# Patient Record
Sex: Male | Born: 1995 | Race: White | Hispanic: No | Marital: Single | Smoking: Former smoker
Health system: Southern US, Community
[De-identification: ages and names within clinical notes are randomized; demographics above are authoritative.]

## PROBLEM LIST (undated history)

## (undated) DIAGNOSIS — F909 Attention-deficit hyperactivity disorder, unspecified type: Secondary | ICD-10-CM

## (undated) HISTORY — DX: Attention-deficit hyperactivity disorder, unspecified type: F90.9

---
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
## 2013-03-25 ENCOUNTER — Ambulatory Visit (INDEPENDENT_AMBULATORY_CARE_PROVIDER_SITE_OTHER): Payer: BC Managed Care – PPO | Admitting: Emergency Medicine

## 2013-03-25 VITALS — BP 128/76 | HR 77 | Temp 98.2°F | Resp 16 | Ht 67.5 in | Wt 176.4 lb

## 2013-03-25 DIAGNOSIS — F329 Major depressive disorder, single episode, unspecified: Secondary | ICD-10-CM

## 2013-03-25 MED ORDER — PAROXETINE HCL 20 MG PO TABS: 20.0000 mg | ORAL_TABLET | Freq: Every day | ORAL | Status: DC

## 2013-03-25 NOTE — Progress Notes (Signed)
[facility] [address], [place] [address] [phone]  Date:  03/25/2013   Name:  Jeffrey Chen   DOB:  [birth year]   MRN:  [id number]  PCP:  No primary provider on file.    Chief Complaint: Depression   History of Present Illness:  Jeffrey Chen is a 17 y.o. very pleasant male patient who presents with the following:  Sent in by his dermatologist for evaluation of depression.  Says that he has always been depressed and is now worse. No suicidal ideation or thoughts of harm to others.  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  Poor appetite and sleeping ok.  Has experienced a 20 pound weight loss.  Not sure whether his cystic acne is contributing to his depression.  Never under treatment for depression previously but took xanax for anxiety.  No improvement with over the counter medications or other home remedies. Denies other complaint or health concern today.   There are no active problems to display for this patient.   Past Medical History  Diagnosis Date  . Depression     History reviewed. No pertinent past surgical history.  History  Substance Use Topics  . Smoking status: Former Smoker -- 0.66 packs/day    Types: Cigarettes    Quit date: 05/25/2011  . Smokeless tobacco: Current User    Types: Chew  . Alcohol Use: Yes    History reviewed. No pertinent family history.  No Known Allergies  Medication list has been reviewed and updated.  No current outpatient prescriptions on file prior to visit.   No current facility-administered medications on file prior to visit.    Review of Systems:  As per HPI, otherwise negative.    Physical Examination: Filed Vitals:   03/25/13 2021  BP: 128/76  Pulse: 77  Temp: 98.2 F (36.8 C)  Resp: 16   Filed Vitals:   03/25/13 2021  Height: 5' 7.5" (1.715 m)  Weight: 176 lb 6.4 oz (80.015 kg)   Body mass index is 27.2 kg/(m^2). Ideal Body Weight: Weight  in (lb) to have BMI = 25: 161.7  GEN: WDWN, NAD, Non-toxic, A & O x 3 HEENT: Atraumatic, Normocephalic. Neck supple. No masses, No LAD. Ears and Nose: No external deformity. CV: RRR, No M/G/R. No JVD. No thrill. No extra heart sounds. PULM: CTA B, no wheezes, crackles, rhonchi. No retractions. No resp. distress. No accessory muscle use. ABD: S, NT, ND, +BS. No rebound. No HSM. EXTR: No c/c/e NEURO Normal gait.  PSYCH: Normally interactive. Conversant. Not depressed or anxious appearing.  Calm demeanor.    Assessment and Plan: Cystic acne Depression paxil   Signed,  Phillips Odor, MD

## 2013-03-25 NOTE — Patient Instructions (Signed)

## 2013-03-28 ENCOUNTER — Other Ambulatory Visit: Payer: Self-pay

## 2013-03-28 MED ORDER — PAROXETINE HCL 20 MG PO TABS: 20.0000 mg | ORAL_TABLET | Freq: Every day | ORAL | Status: DC

## 2013-05-10 ENCOUNTER — Ambulatory Visit (INDEPENDENT_AMBULATORY_CARE_PROVIDER_SITE_OTHER): Payer: BC Managed Care – PPO | Admitting: Internal Medicine

## 2013-05-10 VITALS — BP 112/62 | HR 67 | Temp 98.5°F | Resp 16 | Ht 68.0 in | Wt 183.0 lb

## 2013-05-10 DIAGNOSIS — Z811 Family history of alcohol abuse and dependence: Secondary | ICD-10-CM

## 2013-05-10 DIAGNOSIS — F1010 Alcohol abuse, uncomplicated: Secondary | ICD-10-CM | POA: Insufficient documentation

## 2013-05-10 DIAGNOSIS — F988 Other specified behavioral and emotional disorders with onset usually occurring in childhood and adolescence: Secondary | ICD-10-CM

## 2013-05-10 MED ORDER — AMPHETAMINE-DEXTROAMPHETAMINE 10 MG PO TABS: 10.0000 mg | ORAL_TABLET | Freq: Two times a day (BID) | ORAL | Status: DC
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
## 2013-05-14 NOTE — Progress Notes (Signed)
Sent message to dr Merla Richesdoolittle regarding overbooking him to make this appointment request.

## 2013-05-15 NOTE — Progress Notes (Signed)
Left message for patient to call back regarding scheduling appointment for Dr Merla Richesoolittle at 10 am per providers request.

## 2013-05-15 NOTE — Progress Notes (Signed)
Appointment made for 2/18 @ 10 am with Merla Richesoolittle.

## 2013-06-01 ENCOUNTER — Ambulatory Visit (INDEPENDENT_AMBULATORY_CARE_PROVIDER_SITE_OTHER): Payer: BC Managed Care – PPO | Admitting: Internal Medicine

## 2013-06-01 VITALS — BP 132/80 | HR 94 | Temp 98.0°F | Resp 17 | Ht 68.0 in | Wt 178.0 lb

## 2013-06-01 DIAGNOSIS — F988 Other specified behavioral and emotional disorders with onset usually occurring in childhood and adolescence: Secondary | ICD-10-CM

## 2013-06-01 DIAGNOSIS — F909 Attention-deficit hyperactivity disorder, unspecified type: Secondary | ICD-10-CM

## 2013-06-01 DIAGNOSIS — L709 Acne, unspecified: Secondary | ICD-10-CM | POA: Insufficient documentation

## 2013-06-01 MED ORDER — LISDEXAMFETAMINE DIMESYLATE 70 MG PO CAPS: 70.0000 mg | ORAL_CAPSULE | Freq: Every day | ORAL | Status: DC

## 2013-06-01 NOTE — Progress Notes (Signed)
This chart was scribed for Jeffrey Siaobert Dantre Yearwood, MD by Luisa DagoPriscilla Tutu, ED Scribe. This patient was seen in room 8 and the patient's care was started at 3:48 PM Subjective:    Patient ID: Jeffrey Chen, male    DOB: 07/29/1995, 18 y.o.   MRN: [id number]  Chief Complaint  Patient presents with  . Medication Refill    Adderall     HPI HPI Comments: Jeffrey BatheDustin L Hannum is a 18 y.o. male who presents to [facility] requesting a medication refill for Adderall. Pt states that he was his takes one pill at  6am, another pill at 10am, and half a pill at 2 pm. He states that the medication wears off in less than 3 hours, to the point that he has to drink a lot of energy drinks during his working hours.  He is able to accomplish a lot during the hours that the medicine works. He is very happy about this it seems to have relieved his depression symptoms. It is unclear when his depression emerged but is probably during the first month of Accutane therapy. The Accutane was stopped for a month and there is little change so it was restarted at 40 mg with the intent to progress to 80 mg if he has no deleterious side effects. Pt is inquiring about the extended release medication which would be much more convenient. Pt states that he is traveling to [place] for work. He says that he is going to be there for 1-2 weeks.    Patient Active Problem List   Diagnosis Date Noted  . ADD (attention deficit disorder) 05/10/2013  . F/H of alcoholism-father 05/10/2013  . DUI 2014 05/10/2013   No past medical history on file. No past surgical history on file. No Known Allergies Prior to Admission medications   Medication Sig Start Date End Date Taking? Authorizing Provider  amphetamine-dextroamphetamine (ADDERALL) 10 MG tablet Take 1 tablet (10 mg total) by mouth 2 (two) times daily. 05/10/13  Yes Tonye Pearsonobert P Jaclene Bartelt, MD  ISOtretinoin (ACCUTANE) 40 MG capsule Take 40 mg by mouth 2 (two) times  daily.   Yes Historical Provider, MD   History   Social History  . Marital Status: Single    Spouse Name: N/A    Number of Children: N/A  . Years of Education: N/A   Occupational History  . Not on file.   Social History Main Topics  . Smoking status: Former Smoker -- 0.66 packs/day    Types: Cigarettes    Quit date: 05/25/2011  . Smokeless tobacco: Current User    Types: Chew  . Alcohol Use: Yes  . Drug Use: No  . Sexual Activity: Not on file   Other Topics Concern  . Not on file   Social History Narrative  . No narrative on file   Review of Systems   no headaches or vision changes No chest pain or palpitations No tremor No insomnia No change in appetite  Objective:   Physical Exam  Nursing note and vitals reviewed. Constitutional: He is oriented to person, place, and time. He appears well-developed and well-nourished.  HENT:  Head: Normocephalic and atraumatic.  Cardiovascular: Normal rate.   Pulmonary/Chest: Effort normal.  Abdominal: He exhibits no distension.  Neurological: He is alert and oriented to person, place, and time.  Skin: Skin is warm and dry.  Psychiatric: He has a normal mood and affect.      Filed Vitals:   06/01/13 1521  BP:  132/80  Pulse: 94  Temp: 98 F (36.7 C)  TempSrc: Oral  Resp: 17  Height: 5\' 8"  (1.727 m)  Weight: 178 lb (80.74 kg)  SpO2: 98%    Assessment & Plan:  Acne  ADHD Depression? Secondary to Accutane  Meds ordered this encounter  Medications  . lisdexamfetamine (VYVANSE) 70 MG capsule    Sig: Take 1 capsule (70 mg total) by mouth daily.    Dispense:  30 capsule    Refill:  0   Call or follow up in one month   I have completed the patient encounter in its entirety as documented by the scribe, with editing by me where necessary. Ezechiel Stooksbury P. Merla Riches, M.D.

## 2013-06-05 ENCOUNTER — Ambulatory Visit: Payer: BC Managed Care – PPO | Admitting: Internal Medicine

## 2013-06-08 ENCOUNTER — Ambulatory Visit (INDEPENDENT_AMBULATORY_CARE_PROVIDER_SITE_OTHER): Payer: BC Managed Care – PPO | Admitting: Family Medicine

## 2013-06-08 VITALS — BP 132/70 | HR 98 | Temp 98.5°F | Resp 16 | Ht 67.5 in | Wt 178.0 lb

## 2013-06-08 DIAGNOSIS — F909 Attention-deficit hyperactivity disorder, unspecified type: Secondary | ICD-10-CM

## 2013-06-08 DIAGNOSIS — R5381 Other malaise: Secondary | ICD-10-CM

## 2013-06-08 DIAGNOSIS — K5289 Other specified noninfective gastroenteritis and colitis: Secondary | ICD-10-CM

## 2013-06-08 DIAGNOSIS — R109 Unspecified abdominal pain: Secondary | ICD-10-CM

## 2013-06-08 DIAGNOSIS — R5383 Other fatigue: Secondary | ICD-10-CM

## 2013-06-08 MED ORDER — AMPHETAMINE-DEXTROAMPHETAMINE 10 MG PO TABS: 10.0000 mg | ORAL_TABLET | Freq: Two times a day (BID) | ORAL | Status: DC

## 2013-06-08 MED ORDER — AMPHETAMINE-DEXTROAMPHET ER 30 MG PO CP24: 30.0000 mg | ORAL_CAPSULE | Freq: Every day | ORAL | Status: DC

## 2013-06-08 NOTE — Patient Instructions (Addendum)
Adderall XR 30 one daily     [facility] Policy for Prescribing Controlled Substances (Revised 02/2012) 1. Prescriptions for controlled substances will be filled by ONE provider at [facility] with whom you have established and developed a plan for your care, including follow-up. 2. You are encouraged to schedule an appointment with your prescriber at our appointment center for follow-up visits whenever possible. 3. If you request a prescription for the controlled substance while at [facility] for an acute problem (with someone other than your regular prescriber), you MAY be given a ONE-TIME prescription for a 30-day supply of the controlled substance, to allow time for you to return to see your regular prescriber for additional prescriptions.

## 2013-06-08 NOTE — Progress Notes (Signed)
Subjective: Patient is here for a reassessment with regard to his ADHD medication. Apparently he initially saw Dr. Merla Richesoolittle who started him on medication a little over a month ago. He was on Adderall twice daily. It did not last long enough. He came in for his recheck and after discussion they switched him to Vyvance 70 one daily. He still had a few of the Adderall left over. He tried the above meds for 5 or 6 days and did not feel like it was helping him. He brought the medication back in with him, asking to be put back on Adderall. Apparently he and Dr. Merla Richesoolittle had discussed going on a longer acting Adderall and see how he did on that. He says they talked about going on a little higher dose.  Objective: Physical exam not done today  Assessment: ADHD  Plan: Change in 2 time release Adderall XR 30 mg one daily. This gives him a little higher dose than he was on and only having to take one pill a day without having to carry medication to work.  In the patient's presence and in the presence of medical assistant Angie the remaining by Vyvance were discarded into the OSHA sharps container.  The patient was instructed that he is supposed to see the primary prescriber of his controlled substances. He will see Dr. Merla Richesoolittle next visit. He just missed them today, coming in on Saturday afternoon right after Dr. Merla Richesoolittle left.

## 2013-07-08 ENCOUNTER — Ambulatory Visit (INDEPENDENT_AMBULATORY_CARE_PROVIDER_SITE_OTHER): Payer: BC Managed Care – PPO | Admitting: Internal Medicine

## 2013-07-08 VITALS — BP 121/76 | HR 78 | Temp 98.0°F | Resp 16 | Ht 67.5 in | Wt 169.8 lb

## 2013-07-08 DIAGNOSIS — F988 Other specified behavioral and emotional disorders with onset usually occurring in childhood and adolescence: Secondary | ICD-10-CM

## 2013-07-08 DIAGNOSIS — F909 Attention-deficit hyperactivity disorder, unspecified type: Secondary | ICD-10-CM

## 2013-07-08 MED ORDER — AMPHETAMINE-DEXTROAMPHET ER 30 MG PO CP24: 30.0000 mg | ORAL_CAPSULE | Freq: Every day | ORAL | Status: DC

## 2013-07-08 MED ORDER — AMPHETAMINE-DEXTROAMPHET ER 30 MG PO CP24
30.0000 mg | ORAL_CAPSULE | Freq: Every day | ORAL | Status: DC
Start: 2013-07-08 — End: 2013-09-18

## 2013-07-08 NOTE — Progress Notes (Signed)
   Subjective:    Patient ID: Jeffrey Chen, male    DOB: 1995-12-02, 18 y.o.   MRN: [id number] This chart was scribed for Jeffrey Siaobert Guadalupe Nickless, MD by Nicholos Johnsenise Iheanachor, Medical Scribe. This patient's care was started at 3:31 PM.  HPI HPI Comments: Jeffrey BatheDustin L Chen is a 18 y.o. male who presents to the [facility] for a medication refill. Adderall working much better than the Vyvanse he reports; states Vyvance didn't really do anything for him. Only taking 1 fast acting release Adderall pill per day which helps to level him out. Does not report any severe side effects from the medication. Does report it will knock his appetite down some but he is still able to eat. Sometimes gets some suicidal ideations when going to sleep at night; does not interfere with sleep. Has not finished Accutane; dosage has been bumped back up to 80 mg per day. No longer has depr or anx  Review of Systems  Objective:  Physical Exam  Vitals reviewed. Constitutional: He is oriented to person, place, and time. He appears well-developed and well-nourished. No distress.  HENT:  Head: Normocephalic and atraumatic.  Eyes: EOM are normal.  Neck: Neck supple.  Cardiovascular: Normal rate.   Pulmonary/Chest: Effort normal. No respiratory distress.  Musculoskeletal: Normal range of motion.  Lymphadenopathy:    He has no cervical adenopathy.  Neurological: He is alert and oriented to person, place, and time.  Skin: Skin is warm and dry.  Psychiatric: He has a normal mood and affect. His behavior is normal.   Assessment & Plan:    I have completed the patient encounter in its entirety as documented by the scribe, with editing by me where necessary. Trisha Morandi P. Merla Richesoolittle, M.D. ADHD (attention deficit hyperactivity disorder) - Plan: amphetamine-dextroamphetamine (ADDERALL XR) 30 MG 24 hr capsule, amphetamine-dextroamphetamine (ADDERALL XR) 30 MG 24 hr capsule, amphetamine-dextroamphetamine  (ADDERALL XR) 30 MG 24 hr capsule  Meds ordered this encounter  Medications  . amphetamine-dextroamphetamine (ADDERALL XR) 30 MG 24 hr capsule    Sig: Take 1 capsule (30 mg total) by mouth daily.    Dispense:  30 capsule    Refill:  0  . amphetamine-dextroamphetamine (ADDERALL XR) 30 MG 24 hr capsule    Sig: Take 1 capsule (30 mg total) by mouth daily. For 30 d after date signed    Dispense:  30 capsule    Refill:  0  . amphetamine-dextroamphetamine (ADDERALL XR) 30 MG 24 hr capsule    Sig: Take 1 capsule (30 mg total) by mouth daily. For 60 d after date signed    Dispense:  30 capsule    Refill:  0   F/u 3 mos

## 2013-08-14 ENCOUNTER — Encounter (INDEPENDENT_AMBULATORY_CARE_PROVIDER_SITE_OTHER): Payer: BC Managed Care – PPO | Admitting: Internal Medicine

## 2013-08-14 ENCOUNTER — Encounter: Payer: Self-pay | Admitting: Internal Medicine

## 2013-08-16 NOTE — Progress Notes (Signed)
This encounter was created in error - please disregard.

## 2013-09-18 ENCOUNTER — Encounter: Payer: Self-pay | Admitting: Internal Medicine

## 2013-09-18 ENCOUNTER — Ambulatory Visit (INDEPENDENT_AMBULATORY_CARE_PROVIDER_SITE_OTHER): Payer: BC Managed Care – PPO | Admitting: Internal Medicine

## 2013-09-18 VITALS — BP 130/80 | HR 75 | Temp 98.3°F | Resp 16 | Ht 67.0 in | Wt 161.8 lb

## 2013-09-18 DIAGNOSIS — F32A Depression, unspecified: Secondary | ICD-10-CM

## 2013-09-18 DIAGNOSIS — L708 Other acne: Secondary | ICD-10-CM

## 2013-09-18 DIAGNOSIS — F988 Other specified behavioral and emotional disorders with onset usually occurring in childhood and adolescence: Secondary | ICD-10-CM

## 2013-09-18 DIAGNOSIS — F909 Attention-deficit hyperactivity disorder, unspecified type: Secondary | ICD-10-CM

## 2013-09-18 DIAGNOSIS — F3289 Other specified depressive episodes: Secondary | ICD-10-CM

## 2013-09-18 DIAGNOSIS — L709 Acne, unspecified: Secondary | ICD-10-CM

## 2013-09-18 DIAGNOSIS — F329 Major depressive disorder, single episode, unspecified: Secondary | ICD-10-CM

## 2013-09-18 MED ORDER — AMPHETAMINE-DEXTROAMPHET ER 25 MG PO CP24: 25.0000 mg | ORAL_CAPSULE | ORAL | Status: DC

## 2013-09-18 MED ORDER — AMPHETAMINE-DEXTROAMPHET ER 20 MG PO CP24: 20.0000 mg | ORAL_CAPSULE | Freq: Every day | ORAL | Status: DC
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
## 2013-12-18 ENCOUNTER — Ambulatory Visit: Payer: BC Managed Care – PPO | Admitting: Internal Medicine

## 2014-05-13 ENCOUNTER — Other Ambulatory Visit: Payer: Self-pay

## 2014-05-13 MED ORDER — AMPHETAMINE-DEXTROAMPHET ER 25 MG PO CP24: 25.0000 mg | ORAL_CAPSULE | ORAL | Status: DC

## 2014-05-13 NOTE — Telephone Encounter (Signed)
Pt notified that rx is ready for p/u 

## 2014-05-13 NOTE — Telephone Encounter (Signed)
Pt notified. Will make an appt.

## 2014-05-13 NOTE — Telephone Encounter (Signed)
Time for follow-up although he can have this prescription and set up an appointment Meds ordered this encounter  Medications  . amphetamine-dextroamphetamine (ADDERALL XR) 25 MG 24 hr capsule    Sig: Take 1 capsule by mouth every morning.    Dispense:  30 capsule    Refill:  0

## 2014-05-13 NOTE — Telephone Encounter (Signed)
Patient wants to talk with Dr. Merla Richesoolittle about his   amphetamine-dextroamphetamine (ADDERALL XR) 20 MG 24 hr capsule   [phone]

## 2014-08-26 ENCOUNTER — Telehealth: Payer: Self-pay

## 2014-08-26 NOTE — Telephone Encounter (Signed)
Pt is wondering if he needs to come in for an office visit or if answer questions over the phone to get a refill on his amphetamine-dextroamphetamine (ADDERALL XR) 25 MG 24 hr capsule [[id number]][[id number]] .He travels a lot and is on the road now.  I let him know this is not how we typically do things. He is a pt of Dr. Merla Richesoolittle. Please advise at [phone]

## 2014-08-26 NOTE — Telephone Encounter (Signed)
Sorry--has to be seen q 6mos to keep it legal

## 2014-08-27 NOTE — Telephone Encounter (Signed)
Left message to RTC.
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
## 2015-06-24 ENCOUNTER — Telehealth: Payer: Self-pay

## 2015-06-24 NOTE — Telephone Encounter (Signed)
Pt states he would like to have another refill on his ADDERALL XR 24 HR CAPSULE. Please call [phone]

## 2015-06-24 NOTE — Telephone Encounter (Signed)
No RTC

## 2015-06-24 NOTE — Telephone Encounter (Signed)
Pt advised.

## 2019-04-25 ENCOUNTER — Ambulatory Visit (INDEPENDENT_AMBULATORY_CARE_PROVIDER_SITE_OTHER): Payer: BC Managed Care – PPO | Admitting: Adult Health Nurse Practitioner

## 2019-04-25 ENCOUNTER — Other Ambulatory Visit (HOSPITAL_COMMUNITY)
Admission: RE | Admit: 2019-04-25 | Discharge: 2019-04-25 | Disposition: A | Discharge status: Home / Self Care | Payer: BC Managed Care – PPO | Source: Ambulatory Visit | Attending: Adult Health Nurse Practitioner | Admitting: Adult Health Nurse Practitioner

## 2019-04-25 ENCOUNTER — Encounter: Payer: Self-pay | Admitting: Adult Health Nurse Practitioner

## 2019-04-25 ENCOUNTER — Other Ambulatory Visit: Payer: Self-pay

## 2019-04-25 VITALS — BP 114/64 | HR 66 | Temp 98.8°F | Ht 69.0 in | Wt 209.6 lb

## 2019-04-25 DIAGNOSIS — Z811 Family history of alcohol abuse and dependence: Secondary | ICD-10-CM

## 2019-04-25 DIAGNOSIS — Z113 Encounter for screening for infections with a predominantly sexual mode of transmission: Secondary | ICD-10-CM

## 2019-04-25 DIAGNOSIS — Z1329 Encounter for screening for other suspected endocrine disorder: Secondary | ICD-10-CM

## 2019-04-25 DIAGNOSIS — F339 Major depressive disorder, recurrent, unspecified: Secondary | ICD-10-CM | POA: Diagnosis not present

## 2019-04-25 DIAGNOSIS — A64 Unspecified sexually transmitted disease: Secondary | ICD-10-CM | POA: Diagnosis not present

## 2019-04-25 DIAGNOSIS — Z130 Encounter for screening for diseases of the blood and blood-forming organs and certain disorders involving the immune mechanism: Secondary | ICD-10-CM

## 2019-04-25 DIAGNOSIS — Z13228 Encounter for screening for other metabolic disorders: Secondary | ICD-10-CM

## 2019-04-25 DIAGNOSIS — F909 Attention-deficit hyperactivity disorder, unspecified type: Secondary | ICD-10-CM

## 2019-04-25 HISTORY — DX: Encounter for screening for infections with a predominantly sexual mode of transmission: Z11.3

## 2019-04-25 MED ORDER — AMPHETAMINE-DEXTROAMPHETAMINE 10 MG PO TABS: ORAL_TABLET | ORAL | 0 refills | Status: DC
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
## 2019-04-25 NOTE — Patient Instructions (Addendum)
° ° ° °If you have lab work done today you will be contacted with your lab results within the next 2 weeks.  If you have not heard from us then please contact us. The fastest way to get your results is to register for My Chart. ° ° °IF you received an x-ray today, you will receive an invoice from [facility]. Please contact [facility] at [phone] with questions or concerns regarding your invoice.  ° °IF you received labwork today, you will receive an invoice from [facility]. Please contact [facility] at [phone] with questions or concerns regarding your invoice.  ° °Our billing staff will not be able to assist you with questions regarding bills from these companies. ° °You will be contacted with the lab results as soon as they are available. The fastest way to get your results is to activate your My Chart account. Instructions are located on the last page of this paperwork. If you have not heard from us regarding the results in 2 weeks, please contact this office. °  ° ° °Living With Attention Deficit Hyperactivity Disorder °If you have been diagnosed with attention deficit hyperactivity disorder (ADHD), you may be relieved that you now know why you have felt or behaved a certain way. Still, you may feel overwhelmed about the treatment ahead. You may also wonder how to get the support you need and how to deal with the condition day-to-day. With treatment and support, you can live with ADHD and manage your symptoms. °How to manage lifestyle changes °Managing stress °Stress is your body's reaction to life changes and events, both good and bad. To cope with the stress of an ADHD diagnosis, it may help to: °· Learn more about ADHD. °· Exercise regularly. Even a short daily walk can lower stress levels. °· Participate in training or education programs (including social skills training classes) that teach you to deal with symptoms. ° °Medicines °Your health care provider may suggest certain  medicines if he or she feels that they will help to improve your condition. Stimulant medicines are usually prescribed to treat ADHD, and therapy may also be prescribed. It is important to: °· Avoid using alcohol and other substances that may prevent your medicines from working properly (may interact).  °· Talk with your pharmacist or health care provider about all the medicines that you take, their possible side effects, and what medicines are safe to take together. °· Make it your goal to take part in all treatment decisions (shared decision-making). Ask about possible side effects of medicines that your health care provider recommends, and tell him or her how you feel about having those side effects. It is best if shared decision-making with your health care provider is part of your total treatment plan. °Relationships °To strengthen your relationships with family members while treating your condition, consider taking part in family therapy. You might also attend self-help groups alone or with a loved one. °Be honest about how your symptoms affect your relationships. Make an effort to communicate respectfully instead of fighting, and find ways to show others that you care. Psychotherapy may be useful in helping you cope with how ADHD affects your relationships. °How to recognize changes in your condition °The following signs may mean that your treatment is working well and your condition is improving: °· Consistently being on time for appointments. °· Being more organized at home and work. °· Other people noticing improvements in your behavior. °· Achieving goals that you set for yourself. °· Thinking more clearly. °  The following signs may mean that your treatment is not working very well: °· Feeling impatience or more confusion. °· Missing, forgetting, or being late for appointments. °· An increasing sense of disorganization and messiness. °· More difficulty in reaching goals that you set for yourself. °· Loved  ones becoming angry or frustrated with you. °Where to find support °Talking to others °· Keep emotion out of important discussions and speak in a calm, logical way. °· Listen closely and patiently to your loved ones. Try to understand their point of view, and try to avoid getting defensive. °· Take responsibility for the consequences of your actions. °· Ask that others do not take your behaviors personally. °· Aim to solve problems as they come up, and express your feelings instead of bottling them up. °· Talk openly about what you need from your loved ones and how they can support you. °· Consider going to family therapy sessions or having your family meet with a specialist who deals with ADHD-related behavior problems. °Finances °Not all insurance plans cover mental health care, so it is important to check with your insurance carrier. If paying for co-pays or counseling services is a problem, search for a local or county mental health care center. Public mental health care services may be offered there at a low cost or no cost when you are not able to see a private health care provider. °If you are taking medicine for ADHD, you may be able to get the generic form, which may be less expensive than brand-name medicine. Some makers of prescription medicines also offer help to patients who cannot afford the medicines that they need. °Follow these instructions at home: °· Take over-the-counter and prescription medicines only as told by your health care provider. Check with your health care provider before taking any new medicines. °· Create structure and an organized atmosphere at home. For example: °? Make a list of tasks, then rank them from most important to least important. Work on one task at a time until your listed tasks are done. °? Make a daily schedule and follow it consistently every day. °? Use an appointment calendar, and check it 2 or 3 times a day to keep on track. Keep it with you when you leave the  house. °? Create spaces where you keep certain things, and always put things back in their places after you use them. °· Keep all follow-up visits as told by your health care provider. This is important. °Questions to ask your health care provider: °· What are the risks and benefits of taking medicines? °· Would I benefit from therapy? °· How often should I follow up with a health care provider? °Contact a health care provider if: °· You have side effects from your medicines, such as: °? Repeated muscle twitches, coughing, or speech outbursts. °? Sleep problems. °? Loss of appetite. °? Depression. °? New or worsening behavior problems. °? Dizziness. °? Unusually fast heartbeat. °? Stomach pains. °? Headaches. °Get help right away if: °· You have a severe reaction to a medicine. °· Your behavior suddenly gets worse. °Summary °· With treatment and support, you can live with ADHD and manage your symptoms. °· The medicines that are most often prescribed for ADHD are stimulants. °· Consider taking part in family therapy or self-help groups with family members or friends. °· When you talk with friends and family about your ADHD, be patient and communicate openly. °· Take over-the-counter and prescription medicines only as told by your   health care provider. Check with your health care provider before taking any new medicines. °This information is not intended to replace advice given to you by your health care provider. Make sure you discuss any questions you have with your health care provider. °Document Revised: 07/27/2018 Document Reviewed: 08/04/2016 °Elsevier Patient Education © 2020 [employer] Inc. ° °

## 2019-04-26 LAB — COMPREHENSIVE METABOLIC PANEL
ALT: 96 IU/L — ABNORMAL HIGH (ref 0–44)
AST: 57 IU/L — ABNORMAL HIGH (ref 0–40)
Albumin/Globulin Ratio: 1.8 (ref 1.2–2.2)
Albumin: 4.9 g/dL (ref 4.1–5.2)
Alkaline Phosphatase: 62 IU/L (ref 39–117)
BUN/Creatinine Ratio: 20 (ref 9–20)
BUN: 20 mg/dL (ref 6–20)
Bilirubin Total: 0.5 mg/dL (ref 0.0–1.2)
CO2: 24 mmol/L (ref 20–29)
Calcium: 9.6 mg/dL (ref 8.7–10.2)
Chloride: 100 mmol/L (ref 96–106)
Creatinine, Ser: 0.99 mg/dL (ref 0.76–1.27)
GFR calc Af Amer: 122 mL/min/{1.73_m2} (ref >59)
GFR calc non Af Amer: 105 mL/min/{1.73_m2} (ref >59)
Globulin, Total: 2.7 g/dL (ref 1.5–4.5)
Glucose: 85 mg/dL (ref 65–99)
Potassium: 4.3 mmol/L (ref 3.5–5.2)
Sodium: 139 mmol/L (ref 134–144)
Total Protein: 7.6 g/dL (ref 6.0–8.5)

## 2019-04-26 LAB — GC/CHLAMYDIA PROBE AMP (~~LOC~~) NOT AT ARMC
Chlamydia: POSITIVE — AB
Comment: NEGATIVE
Comment: NORMAL
Neisseria Gonorrhea: NEGATIVE

## 2019-04-26 LAB — HIV ANTIBODY (ROUTINE TESTING W REFLEX): HIV Screen 4th Generation wRfx: NONREACTIVE

## 2019-04-26 LAB — TSH: TSH: 1.19 u[IU]/mL (ref 0.450–4.500)

## 2019-04-26 LAB — HEPATITIS B SURFACE ANTIGEN: Hepatitis B Surface Ag: NEGATIVE

## 2019-04-26 LAB — RPR: RPR Ser Ql: NONREACTIVE

## 2019-05-01 DIAGNOSIS — Z6829 Body mass index (BMI) 29.0-29.9, adult: Secondary | ICD-10-CM | POA: Diagnosis not present

## 2019-05-01 DIAGNOSIS — Z20828 Contact with and (suspected) exposure to other viral communicable diseases: Secondary | ICD-10-CM | POA: Diagnosis not present

## 2019-05-21 ENCOUNTER — Encounter: Payer: Self-pay | Admitting: Adult Health Nurse Practitioner

## 2019-05-21 ENCOUNTER — Telehealth: Payer: Self-pay | Admitting: Adult Health Nurse Practitioner

## 2019-05-21 ENCOUNTER — Ambulatory Visit (INDEPENDENT_AMBULATORY_CARE_PROVIDER_SITE_OTHER): Payer: BC Managed Care – PPO | Admitting: Adult Health Nurse Practitioner

## 2019-05-21 ENCOUNTER — Other Ambulatory Visit: Payer: Self-pay

## 2019-05-21 VITALS — BP 136/89 | HR 90 | Temp 98.4°F | Ht 69.0 in | Wt 195.6 lb

## 2019-05-21 DIAGNOSIS — A749 Chlamydial infection, unspecified: Secondary | ICD-10-CM | POA: Insufficient documentation

## 2019-05-21 MED ORDER — AZITHROMYCIN 500 MG PO TABS: 500.0000 mg | ORAL_TABLET | Freq: Every day | ORAL | 0 refills | Status: AC
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
## 2019-05-21 NOTE — Patient Instructions (Signed)
° ° ° °  If you have lab work done today you will be contacted with your lab results within the next 2 weeks.  If you have not heard from us then please contact us. The fastest way to get your results is to register for My Chart. ° ° °IF you received an x-ray today, you will receive an invoice from [facility]. Please contact [facility] at [phone] with questions or concerns regarding your invoice.  ° °IF you received labwork today, you will receive an invoice from [facility]. Please contact [facility] at [phone] with questions or concerns regarding your invoice.  ° °Our billing staff will not be able to assist you with questions regarding bills from these companies. ° °You will be contacted with the lab results as soon as they are available. The fastest way to get your results is to activate your My Chart account. Instructions are located on the last page of this paperwork. If you have not heard from us regarding the results in 2 weeks, please contact this office. °  ° ° ° °

## 2019-07-03 ENCOUNTER — Other Ambulatory Visit: Payer: Self-pay | Admitting: Adult Health Nurse Practitioner

## 2019-07-03 NOTE — Telephone Encounter (Signed)
Requested medication (s) are due for refill today: Yes  Requested medication (s) are on the active medication list: Yes  Last refill:  04/25/19  Future visit scheduled: No  Notes to clinic:  See request.    Requested Prescriptions  Pending Prescriptions Disp Refills   amphetamine-dextroamphetamine (ADDERALL) 10 MG tablet 45 tablet 0    Sig: 1 to 1.5 tab daily in the a.m.      Not Delegated - Psychiatry:  Stimulants/ADHD Failed - 07/03/2019  9:39 AM      Failed - This refill cannot be delegated      Failed - Urine Drug Screen completed in last 360 days.      Passed - Valid encounter within last 3 months    Recent Outpatient Visits           1 month ago Chlamydia   [facility] at [facility], Lonna [place], NP   2 months ago Adult ADHD   [facility] Care at [facility], Lonna [place], NP   5 years ago ADHD (attention deficit hyperactivity disorder)   Primary Care at [facility], Harrel [place], MD   5 years ago ADHD (attention deficit hyperactivity disorder)   Primary Care at [facility], Harrel [place], MD   6 years ago ADHD (attention deficit hyperactivity disorder)   Primary Care at [facility], Sandria Bales, MD

## 2019-07-03 NOTE — Telephone Encounter (Signed)
Medication Refill - Medication: amphetamine-dextroamphetamine (ADDERALL) 10 MG tablet    Preferred Pharmacy (with phone number or street name):  [id number] - [place], [place] - [address] AT [facility] OF [facility] [address] Phone:  [phone]  Fax:  [phone]       Agent: Please be advised that RX refills may take up to 3 business days. We ask that you follow-up with your pharmacy.

## 2019-07-09 ENCOUNTER — Telehealth: Payer: Self-pay

## 2019-07-09 NOTE — Telephone Encounter (Signed)
Pt wants to use temporary pharmacy [address] [place] in [place], [place] [address] for this refill please advise

## 2019-07-09 NOTE — Telephone Encounter (Signed)
  Patients wants to use temporary pharmacy [address] [place] in [place] , [place] [address]    Patient is requesting a refill of the following medications: Requested Prescriptions   Pending Prescriptions Disp Refills  . amphetamine-dextroamphetamine (ADDERALL) 10 MG tablet 45 tablet 0    Sig: 1 to 1.5 tab daily in the a.m.    Date of patient request: 07/09/2019 Last office visit: 05/21/2019 Date of last refill: 04/25/2019 Last refill amount: 45 tablets  Follow up time period per chart: No follow up visit scheduled

## 2019-07-10 MED ORDER — AMPHETAMINE-DEXTROAMPHETAMINE 10 MG PO TABS: ORAL_TABLET | ORAL | 0 refills | Status: AC

## 2019-07-10 NOTE — Telephone Encounter (Signed)
Na

## 2019-07-11 NOTE — Telephone Encounter (Signed)
Pt calling to check on this.  States that he has no medication left and he has been out for a week and a half.

## 2019-12-06 DIAGNOSIS — R0681 Apnea, not elsewhere classified: Secondary | ICD-10-CM | POA: Diagnosis not present

## 2019-12-06 DIAGNOSIS — F10120 Alcohol abuse with intoxication, uncomplicated: Secondary | ICD-10-CM | POA: Diagnosis not present

## 2019-12-06 DIAGNOSIS — T40601A Poisoning by unspecified narcotics, accidental (unintentional), initial encounter: Secondary | ICD-10-CM | POA: Diagnosis not present

## 2019-12-06 DIAGNOSIS — T402X1A Poisoning by other opioids, accidental (unintentional), initial encounter: Secondary | ICD-10-CM | POA: Diagnosis not present

## 2019-12-06 DIAGNOSIS — R230 Cyanosis: Secondary | ICD-10-CM | POA: Diagnosis not present

## 2019-12-06 DIAGNOSIS — Y906 Blood alcohol level of 120-199 mg/100 ml: Secondary | ICD-10-CM | POA: Diagnosis not present

## 2019-12-06 DIAGNOSIS — R000 Tachycardia, unspecified: Secondary | ICD-10-CM | POA: Diagnosis not present

## 2019-12-06 DIAGNOSIS — D72829 Elevated white blood cell count, unspecified: Secondary | ICD-10-CM | POA: Diagnosis not present

## 2019-12-07 DIAGNOSIS — R000 Tachycardia, unspecified: Secondary | ICD-10-CM | POA: Diagnosis not present

## 2019-12-07 DIAGNOSIS — T40601A Poisoning by unspecified narcotics, accidental (unintentional), initial encounter: Secondary | ICD-10-CM | POA: Diagnosis not present

## 2020-10-29 DIAGNOSIS — R109 Unspecified abdominal pain: Secondary | ICD-10-CM | POA: Diagnosis not present

## 2020-10-29 DIAGNOSIS — R112 Nausea with vomiting, unspecified: Secondary | ICD-10-CM | POA: Diagnosis not present

## 2022-09-12 ENCOUNTER — Other Ambulatory Visit: Payer: Self-pay

## 2022-09-12 ENCOUNTER — Encounter (HOSPITAL_COMMUNITY): Payer: Self-pay

## 2022-09-12 ENCOUNTER — Emergency Department (HOSPITAL_COMMUNITY)
Admission: EM | Admit: 2022-09-12 | Discharge: 2022-09-12 | Disposition: A | Discharge status: Home / Self Care | Attending: Emergency Medicine | Admitting: Emergency Medicine

## 2022-09-12 ENCOUNTER — Emergency Department (HOSPITAL_COMMUNITY)

## 2022-09-12 DIAGNOSIS — R739 Hyperglycemia, unspecified: Secondary | ICD-10-CM | POA: Insufficient documentation

## 2022-09-12 DIAGNOSIS — L03115 Cellulitis of right lower limb: Secondary | ICD-10-CM | POA: Diagnosis not present

## 2022-09-12 DIAGNOSIS — M79604 Pain in right leg: Secondary | ICD-10-CM | POA: Diagnosis present

## 2022-09-12 LAB — COMPREHENSIVE METABOLIC PANEL
ALT: 45 U/L — ABNORMAL HIGH (ref 0–44)
AST: 27 U/L (ref 15–41)
Albumin: 3.7 g/dL (ref 3.5–5.0)
Alkaline Phosphatase: 98 U/L (ref 38–126)
Anion gap: 16 — ABNORMAL HIGH (ref 5–15)
BUN: 15 mg/dL (ref 6–20)
CO2: 22 mmol/L (ref 22–32)
Calcium: 9.3 mg/dL (ref 8.9–10.3)
Chloride: 92 mmol/L — ABNORMAL LOW (ref 98–111)
Creatinine, Ser: 0.67 mg/dL (ref 0.61–1.24)
GFR, Estimated: >60 mL/min (ref >60)
Glucose, Bld: 437 mg/dL — ABNORMAL HIGH (ref 70–99)
Potassium: 3.9 mmol/L (ref 3.5–5.1)
Sodium: 130 mmol/L — ABNORMAL LOW (ref 135–145)
Total Bilirubin: 0.9 mg/dL (ref 0.3–1.2)
Total Protein: 7.9 g/dL (ref 6.5–8.1)

## 2022-09-12 LAB — CBC WITH DIFFERENTIAL/PLATELET
Abs Immature Granulocytes: 0.02 10*3/uL (ref 0.00–0.07)
Basophils Absolute: 0 10*3/uL (ref 0.0–0.1)
Basophils Relative: 1 %
Eosinophils Absolute: 0 10*3/uL (ref 0.0–0.5)
Eosinophils Relative: 0 %
HCT: 46 % (ref 39.0–52.0)
Hemoglobin: 16 g/dL (ref 13.0–17.0)
Immature Granulocytes: 0 %
Lymphocytes Relative: 19 %
Lymphs Abs: 1.2 10*3/uL (ref 0.7–4.0)
MCH: 30.1 pg (ref 26.0–34.0)
MCHC: 34.8 g/dL (ref 30.0–36.0)
MCV: 86.6 fL (ref 80.0–100.0)
Monocytes Absolute: 0.4 10*3/uL (ref 0.1–1.0)
Monocytes Relative: 7 %
Neutro Abs: 4.3 10*3/uL (ref 1.7–7.7)
Neutrophils Relative %: 73 %
Platelets: 243 10*3/uL (ref 150–400)
RBC: 5.31 MIL/uL (ref 4.22–5.81)
RDW: 12 % (ref 11.5–15.5)
WBC: 6 10*3/uL (ref 4.0–10.5)
nRBC: 0 % (ref 0.0–0.2)

## 2022-09-12 LAB — BASIC METABOLIC PANEL
Anion gap: 13 (ref 5–15)
BUN: 13 mg/dL (ref 6–20)
CO2: 23 mmol/L (ref 22–32)
Calcium: 8.5 mg/dL — ABNORMAL LOW (ref 8.9–10.3)
Chloride: 95 mmol/L — ABNORMAL LOW (ref 98–111)
Creatinine, Ser: 0.6 mg/dL — ABNORMAL LOW (ref 0.61–1.24)
GFR, Estimated: >60 mL/min (ref >60)
Glucose, Bld: 324 mg/dL — ABNORMAL HIGH (ref 70–99)
Potassium: 3.5 mmol/L (ref 3.5–5.1)
Sodium: 131 mmol/L — ABNORMAL LOW (ref 135–145)

## 2022-09-12 LAB — LACTIC ACID, PLASMA: Lactic Acid, Venous: 1 mmol/L (ref 0.5–1.9)

## 2022-09-12 LAB — BLOOD GAS, VENOUS
Acid-Base Excess: 2.5 mmol/L — ABNORMAL HIGH (ref 0.0–2.0)
Bicarbonate: 27.9 mmol/L (ref 20.0–28.0)
Drawn by: 432
O2 Saturation: 84.4 %
Patient temperature: 36.5
pCO2, Ven: 44 mmHg (ref 44–60)
pH, Ven: 7.41 (ref 7.25–7.43)
pO2, Ven: 49 mmHg — ABNORMAL HIGH (ref 32–45)

## 2022-09-12 LAB — URINALYSIS, ROUTINE W REFLEX MICROSCOPIC
Bacteria, UA: NONE SEEN
Bilirubin Urine: NEGATIVE
Glucose, UA: >=500 mg/dL — AB
Hgb urine dipstick: NEGATIVE
Ketones, ur: 80 mg/dL — AB
Leukocytes,Ua: NEGATIVE
Nitrite: NEGATIVE
Protein, ur: NEGATIVE mg/dL
Specific Gravity, Urine: 1.035 — ABNORMAL HIGH (ref 1.005–1.030)
pH: 6 (ref 5.0–8.0)

## 2022-09-12 LAB — CBG MONITORING, ED
Glucose-Capillary: 429 mg/dL — ABNORMAL HIGH (ref 70–99)
Glucose-Capillary: 337 mg/dL — ABNORMAL HIGH (ref 70–99)
Glucose-Capillary: 261 mg/dL — ABNORMAL HIGH (ref 70–99)
Glucose-Capillary: 353 mg/dL — ABNORMAL HIGH (ref 70–99)

## 2022-09-12 LAB — BETA-HYDROXYBUTYRIC ACID: Beta-Hydroxybutyric Acid: 1.32 mmol/L — ABNORMAL HIGH (ref 0.05–0.27)

## 2022-09-12 LAB — MAGNESIUM: Magnesium: 1.8 mg/dL (ref 1.7–2.4)

## 2022-09-12 MED ORDER — METFORMIN HCL 1000 MG PO TABS: 1000.0000 mg | ORAL_TABLET | Freq: Two times a day (BID) | ORAL | 2 refills | Status: AC

## 2022-09-12 MED ORDER — INSULIN ASPART 100 UNIT/ML IJ SOLN
8.0000 [IU] | Freq: Once | INTRAMUSCULAR | Status: AC
  Administered 2022-09-12: 8 [IU] via SUBCUTANEOUS
  Filled 2022-09-12: qty 1

## 2022-09-12 MED ORDER — LACTATED RINGERS IV BOLUS (SEPSIS)
2000.0000 mL | Freq: Once | INTRAVENOUS | Status: AC
  Administered 2022-09-12: 2000 mL via INTRAVENOUS

## 2022-09-12 MED ORDER — OXYCODONE-ACETAMINOPHEN 5-325 MG PO TABS
1.0000 | ORAL_TABLET | Freq: Once | ORAL | Status: AC
  Administered 2022-09-12: 1 via ORAL
  Filled 2022-09-12: qty 1

## 2022-09-12 MED ORDER — LACTATED RINGERS IV BOLUS
1000.0000 mL | Freq: Once | INTRAVENOUS | Status: AC
  Administered 2022-09-12: 1000 mL via INTRAVENOUS

## 2022-09-12 MED ORDER — DEXTROSE 5 % IV SOLN
1500.0000 mg | Freq: Once | INTRAVENOUS | Status: AC
  Administered 2022-09-12: 1500 mg via INTRAVENOUS
  Filled 2022-09-12: qty 75

## 2022-09-12 NOTE — Discharge Instructions (Addendum)
You were treated for cellulitis (skin infection) with an antibiotic that works long-term.  Your area of swelling, redness, and drainage should improve.  If it does not start to improve within the next few days, you will need to get seen again.  Your lab work showed elevated blood sugar.  You likely have diabetes.  A prescription is attached for a medication called metformin.  This will help control your blood sugar.  Maintaining a low glycemic diet and drinking plenty of water will also help.  You will need to get established with a primary care doctor for ongoing management of diabetes and possible escalation in medication management.  There is a telephone number below that you can call to establish a primary care doctor.  Please return the emergency department for any new or worsening symptoms of concern.
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
## 2022-10-10 ENCOUNTER — Other Ambulatory Visit (HOSPITAL_COMMUNITY): Payer: Self-pay
